# Patient Record
Sex: Female | Born: 2000
Health system: Southern US, Community
[De-identification: ages and names within clinical notes are randomized; demographics above are authoritative.]

## PROBLEM LIST (undated history)

## (undated) DIAGNOSIS — R51 Headache: Secondary | ICD-10-CM

## (undated) DIAGNOSIS — T426X1A Poisoning by other antiepileptic and sedative-hypnotic drugs, accidental (unintentional), initial encounter: Secondary | ICD-10-CM

## (undated) DIAGNOSIS — R519 Headache, unspecified: Secondary | ICD-10-CM

## (undated) DIAGNOSIS — S63502A Unspecified sprain of left wrist, initial encounter: Secondary | ICD-10-CM

## (undated) DIAGNOSIS — F39 Unspecified mood [affective] disorder: Secondary | ICD-10-CM

## (undated) DIAGNOSIS — E669 Obesity, unspecified: Secondary | ICD-10-CM

## (undated) DIAGNOSIS — F319 Bipolar disorder, unspecified: Secondary | ICD-10-CM

## (undated) DIAGNOSIS — T7840XA Allergy, unspecified, initial encounter: Secondary | ICD-10-CM

## (undated) DIAGNOSIS — F909 Attention-deficit hyperactivity disorder, unspecified type: Secondary | ICD-10-CM

## (undated) DIAGNOSIS — F419 Anxiety disorder, unspecified: Secondary | ICD-10-CM

## (undated) HISTORY — DX: Unspecified sprain of left wrist, initial encounter: S63.502A

## (undated) HISTORY — DX: Attention-deficit hyperactivity disorder, unspecified type: F90.9

## (undated) HISTORY — DX: Poisoning by other antiepileptic and sedative-hypnotic drugs, accidental (unintentional), initial encounter: T42.6X1A

## (undated) HISTORY — DX: Unspecified mood (affective) disorder: F39

---
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
## 2022-01-18 ENCOUNTER — Ambulatory Visit: Payer: BC Managed Care – PPO | Admitting: Family Medicine

## 2022-02-01 ENCOUNTER — Ambulatory Visit: Payer: BC Managed Care – PPO | Admitting: Family Medicine

## 2022-02-01 ENCOUNTER — Encounter: Payer: Self-pay | Admitting: Family Medicine

## 2022-02-01 VITALS — BP 115/71 | HR 94 | Temp 97.7°F | Ht 63.0 in | Wt 235.0 lb

## 2022-02-01 DIAGNOSIS — R635 Abnormal weight gain: Secondary | ICD-10-CM

## 2022-02-01 DIAGNOSIS — M7989 Other specified soft tissue disorders: Secondary | ICD-10-CM

## 2022-02-01 DIAGNOSIS — R42 Dizziness and giddiness: Secondary | ICD-10-CM | POA: Diagnosis not present

## 2022-02-01 DIAGNOSIS — R5383 Other fatigue: Secondary | ICD-10-CM

## 2022-02-01 NOTE — Patient Instructions (Signed)
Avoid 5 whites we discussed today.  ?Increase water consumption to at least 80 ounces a day.  ?Low salt diet.  ? ?We will call with lab results and further plan.  ? ? ? ? ?
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
## 2022-02-02 LAB — CBC WITH DIFFERENTIAL/PLATELET
Absolute Monocytes: 1154 cells/uL — ABNORMAL HIGH (ref 200–950)
Basophils Absolute: 56 cells/uL (ref 0–200)
Basophils Relative: 0.5 %
Eosinophils Absolute: 123 cells/uL (ref 15–500)
Eosinophils Relative: 1.1 %
HCT: 38.2 % (ref 35.0–45.0)
Hemoglobin: 13.1 g/dL (ref 11.7–15.5)
Lymphs Abs: 4166 cells/uL — ABNORMAL HIGH (ref 850–3900)
MCH: 31.6 pg (ref 27.0–33.0)
MCHC: 34.3 g/dL (ref 32.0–36.0)
MCV: 92.3 fL (ref 80.0–100.0)
MPV: 11.4 fL (ref 7.5–12.5)
Monocytes Relative: 10.3 %
Neutro Abs: 5701 cells/uL (ref 1500–7800)
Neutrophils Relative %: 50.9 %
Platelets: 250 10*3/uL (ref 140–400)
RBC: 4.14 10*6/uL (ref 3.80–5.10)
RDW: 13 % (ref 11.0–15.0)
Total Lymphocyte: 37.2 %
WBC: 11.2 10*3/uL — ABNORMAL HIGH (ref 3.8–10.8)

## 2022-02-02 LAB — COMPREHENSIVE METABOLIC PANEL
AG Ratio: 1.5 (calc) (ref 1.0–2.5)
ALT: 10 U/L (ref 5–32)
AST: 13 U/L (ref 12–32)
Albumin: 3.4 g/dL — ABNORMAL LOW (ref 3.6–5.1)
Alkaline phosphatase (APISO): 56 U/L (ref 36–128)
BUN: 8 mg/dL (ref 7–20)
CO2: 24 mmol/L (ref 20–32)
Calcium: 9.8 mg/dL (ref 8.9–10.4)
Chloride: 106 mmol/L (ref 98–110)
Creat: 0.53 mg/dL (ref 0.50–0.96)
Globulin: 2.3 g/dL (calc) (ref 2.0–3.8)
Glucose, Bld: 133 mg/dL — ABNORMAL HIGH (ref 65–99)
Potassium: 4.3 mmol/L (ref 3.8–5.1)
Sodium: 139 mmol/L (ref 135–146)
Total Bilirubin: 0.3 mg/dL (ref 0.2–1.1)
Total Protein: 5.7 g/dL — ABNORMAL LOW (ref 6.3–8.2)

## 2022-02-02 LAB — IRON,TIBC AND FERRITIN PANEL
%SAT: 23 % (calc) (ref 15–45)
Ferritin: 20 ng/mL (ref 16–154)
Iron: 94 ug/dL (ref 27–164)
TIBC: 409 mcg/dL (calc) (ref 271–448)

## 2022-02-02 LAB — TSH: TSH: 3.03 mIU/L

## 2022-02-02 LAB — HEMOGLOBIN A1C
Hgb A1c MFr Bld: 5.9 % of total Hgb — ABNORMAL HIGH (ref <5.7)
Mean Plasma Glucose: 123 mg/dL
eAG (mmol/L): 6.8 mmol/L

## 2022-02-04 ENCOUNTER — Telehealth: Payer: Self-pay | Admitting: Family Medicine

## 2022-02-04 DIAGNOSIS — R600 Localized edema: Secondary | ICD-10-CM | POA: Insufficient documentation

## 2022-02-04 DIAGNOSIS — R0609 Other forms of dyspnea: Secondary | ICD-10-CM | POA: Insufficient documentation

## 2022-02-04 DIAGNOSIS — E1169 Type 2 diabetes mellitus with other specified complication: Secondary | ICD-10-CM | POA: Insufficient documentation

## 2022-02-04 DIAGNOSIS — E661 Drug-induced obesity: Secondary | ICD-10-CM

## 2022-02-04 HISTORY — DX: Localized edema: R60.0

## 2022-02-04 HISTORY — DX: Other forms of dyspnea: R06.09

## 2022-02-04 MED ORDER — OZEMPIC (0.25 OR 0.5 MG/DOSE) 2 MG/3ML ~~LOC~~ SOPN: 0.2500 mg | PEN_INJECTOR | SUBCUTANEOUS | 5 refills | Status: DC

## 2022-02-04 NOTE — Telephone Encounter (Signed)
Spoke with pt mother regarding labs and instructions.  °

## 2022-02-04 NOTE — Telephone Encounter (Signed)
Please call patient: ?Her glucose is elevated at 133, this is in the diabetic range. ?Her A1c is elevated to 5.9 ?Blood cell counts, electrolytes and thyroid levels were all normal. ?Since she is already on metformin with an A1c of 5.9 and a elevated fasting glucose, I would recommend this trying Ozempic as an add-on. ?(FYI: We discussed this during the visit). ? ? ?Please inform patient I would like to follow-up with her in 3-4 weeks for nutrition counseling.  ?In the meantime I would like her to write down in a notebook everything she eats or drinks for 2 weeks so that we can review.  Make the changes we discussed during her visit. ? ?If/once Ozempic is approved, she can have a nurse visit to instruct on proper technique of weekly injection. ? ? ?

## 2022-02-18 DIAGNOSIS — F319 Bipolar disorder, unspecified: Secondary | ICD-10-CM | POA: Diagnosis not present

## 2022-02-18 DIAGNOSIS — F4010 Social phobia, unspecified: Secondary | ICD-10-CM | POA: Diagnosis not present

## 2022-02-21 ENCOUNTER — Ambulatory Visit: Payer: BC Managed Care – PPO

## 2022-03-17 IMAGING — DX DG FOOT COMPLETE 3+V*R*
3 series · 3 of 3 positions shown · non-contrast
Comparison: None.

CLINICAL DATA: Fall, bruising

EXAM:
RIGHT FOOT COMPLETE - 3+ VIEW; LEFT FOOT - COMPLETE 3+ VIEW

[foot lat]
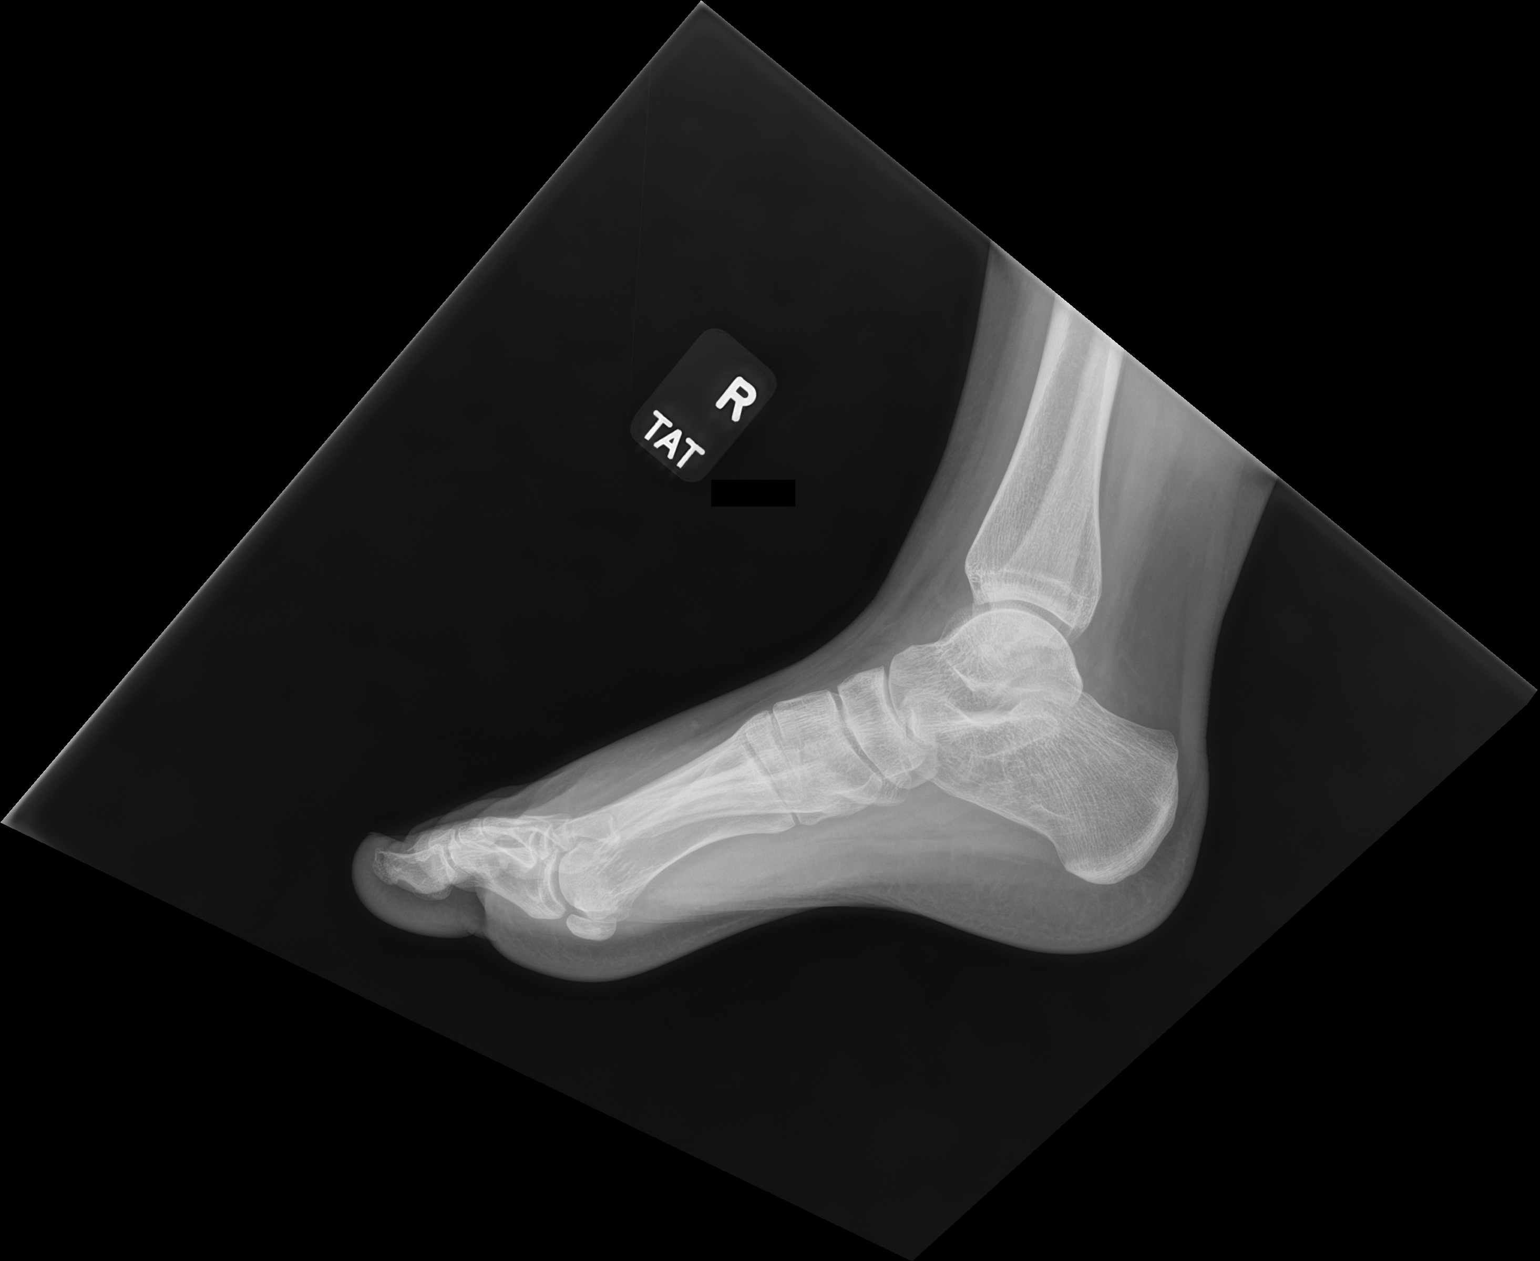

[foot obl]
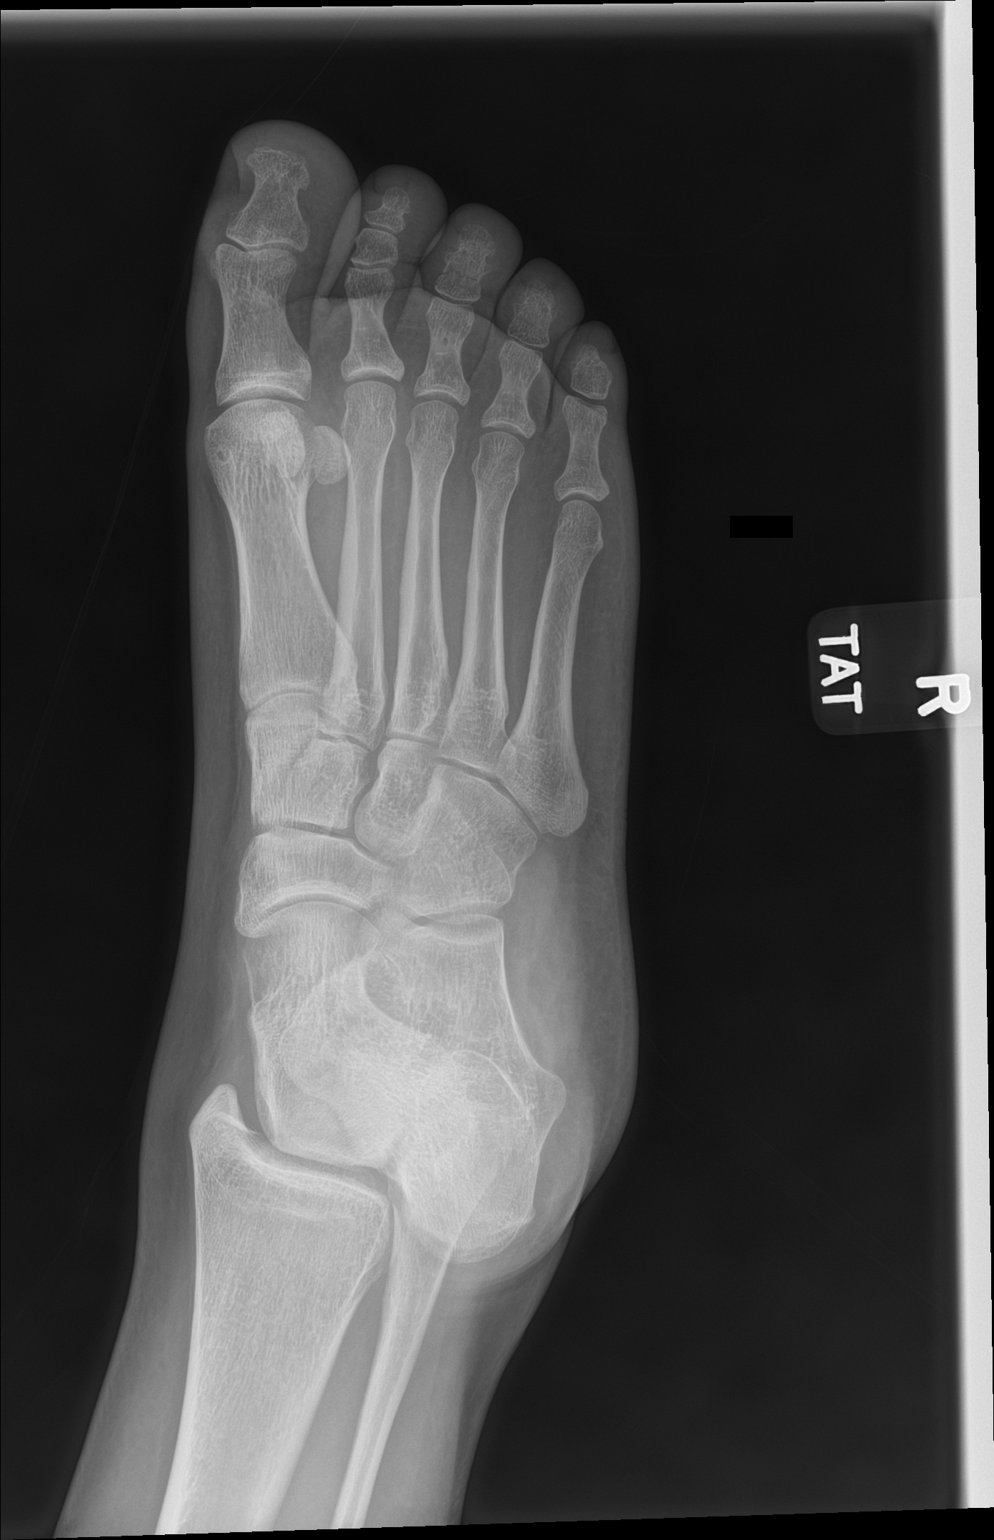

[foot ap]
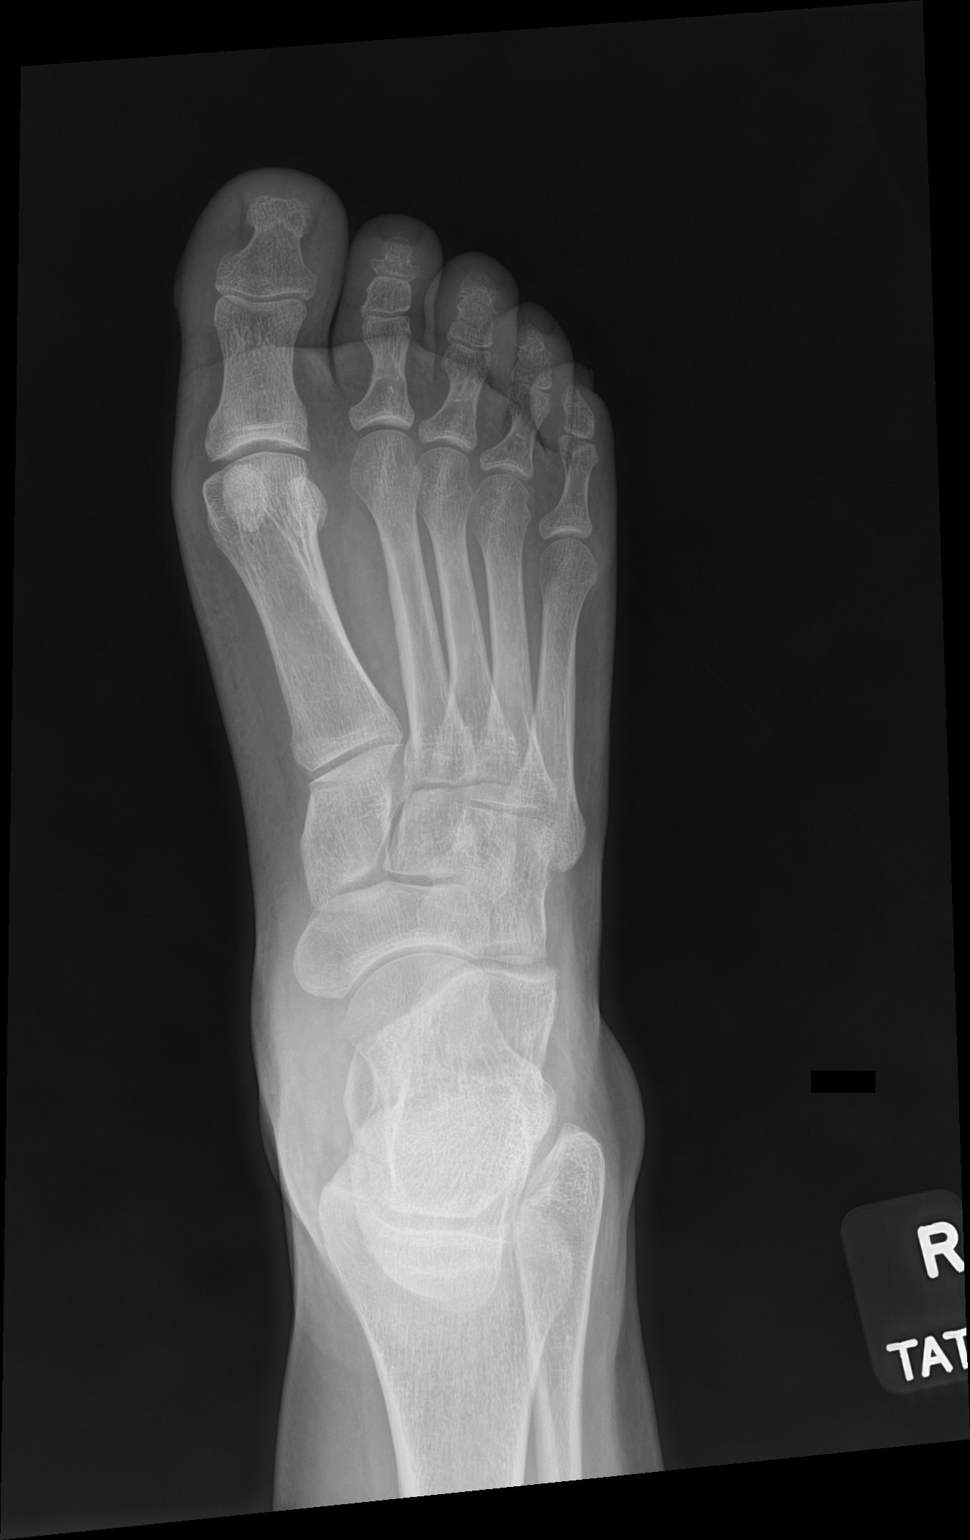

[3 of 3 positions shown; findings below may reference images not displayed]

FINDINGS: Right foot: Frontal, oblique, and lateral views of the right foot
are obtained. No acute fracture, subluxation, or dislocation. Joint
spaces are well preserved. Prominent soft tissue swelling of the
forefoot.

Left foot: Frontal, oblique, lateral views are obtained. No
fracture, subluxation, or dislocation. Joint spaces are well
preserved. Prominent soft tissue swelling of the forefoot.
IMPRESSION: 1. Bilateral soft tissue swelling. No acute fracture within either
foot.

## 2022-03-18 DIAGNOSIS — F4010 Social phobia, unspecified: Secondary | ICD-10-CM | POA: Diagnosis not present

## 2022-03-18 DIAGNOSIS — F319 Bipolar disorder, unspecified: Secondary | ICD-10-CM | POA: Diagnosis not present

## 2022-05-09 ENCOUNTER — Encounter: Payer: Self-pay | Admitting: Family Medicine

## 2022-05-22 DIAGNOSIS — F319 Bipolar disorder, unspecified: Secondary | ICD-10-CM | POA: Diagnosis not present

## 2022-05-22 DIAGNOSIS — F411 Generalized anxiety disorder: Secondary | ICD-10-CM | POA: Diagnosis not present

## 2022-05-22 DIAGNOSIS — Z79899 Other long term (current) drug therapy: Secondary | ICD-10-CM | POA: Diagnosis not present

## 2022-05-22 DIAGNOSIS — E661 Drug-induced obesity: Secondary | ICD-10-CM | POA: Diagnosis not present

## 2023-11-20 LAB — HEPATIC FUNCTION PANEL
AST: 13 (ref 13–35)
Alkaline Phosphatase: 60 (ref 25–125)
Bilirubin, Total: 0.3

## 2023-11-20 LAB — BASIC METABOLIC PANEL
BUN: 5 (ref 4–21)
Chloride: 102 (ref 99–108)
Creatinine: 0.6 (ref 0.5–1.1)
Glucose: 104
Potassium: 4.3 meq/L (ref 3.5–5.1)
Sodium: 138 (ref 137–147)

## 2023-11-20 LAB — HEMOGLOBIN A1C: Hemoglobin A1C: 6.4

## 2023-11-20 LAB — COMPREHENSIVE METABOLIC PANEL: Calcium: 9.7 (ref 8.7–10.7)

## 2023-11-20 LAB — CBC: RBC: 4.41 (ref 3.87–5.11)

## 2023-11-20 LAB — CBC AND DIFFERENTIAL
HCT: 41 (ref 36–46)
Hemoglobin: 13.9 (ref 12.0–16.0)
Neutrophils Absolute: 5.7
Platelets: 245 10*3/uL (ref 150–400)
WBC: 10.3

## 2023-11-20 LAB — TSH: TSH: 4.91 (ref 0.41–5.90)

## 2023-12-01 ENCOUNTER — Encounter: Payer: Self-pay | Admitting: Family Medicine

## 2023-12-08 ENCOUNTER — Ambulatory Visit (INDEPENDENT_AMBULATORY_CARE_PROVIDER_SITE_OTHER): Payer: 59 | Admitting: Family Medicine

## 2023-12-08 ENCOUNTER — Encounter: Payer: Self-pay | Admitting: Family Medicine

## 2023-12-08 ENCOUNTER — Telehealth: Payer: Self-pay

## 2023-12-08 VITALS — BP 99/65 | HR 76 | Ht 63.0 in | Wt 241.8 lb

## 2023-12-08 DIAGNOSIS — E1169 Type 2 diabetes mellitus with other specified complication: Secondary | ICD-10-CM | POA: Diagnosis not present

## 2023-12-08 DIAGNOSIS — Z6841 Body Mass Index (BMI) 40.0 and over, adult: Secondary | ICD-10-CM

## 2023-12-08 DIAGNOSIS — E66813 Obesity, class 3: Secondary | ICD-10-CM | POA: Diagnosis not present

## 2023-12-08 DIAGNOSIS — Z7984 Long term (current) use of oral hypoglycemic drugs: Secondary | ICD-10-CM

## 2023-12-08 DIAGNOSIS — R7989 Other specified abnormal findings of blood chemistry: Secondary | ICD-10-CM

## 2023-12-08 DIAGNOSIS — B354 Tinea corporis: Secondary | ICD-10-CM | POA: Diagnosis not present

## 2023-12-08 DIAGNOSIS — E661 Drug-induced obesity: Secondary | ICD-10-CM

## 2023-12-08 DIAGNOSIS — Z7985 Long-term (current) use of injectable non-insulin antidiabetic drugs: Secondary | ICD-10-CM

## 2023-12-08 MED ORDER — MOUNJARO 2.5 MG/0.5ML ~~LOC~~ SOAJ: 2.5000 mg | SUBCUTANEOUS | 2 refills | Status: DC

## 2023-12-08 MED ORDER — CLOTRIMAZOLE 1 % EX CREA: 1.0000 | TOPICAL_CREAM | Freq: Two times a day (BID) | CUTANEOUS | 0 refills | Status: AC

## 2023-12-08 NOTE — Telephone Encounter (Signed)
 Labs printed and given to PCP.

## 2023-12-08 NOTE — Patient Instructions (Addendum)
 Return in about 15 weeks (around 03/22/2024) for cpe (20 min).        Great to see you today.  I have refilled the medication(s) we provide.   If labs were collected or images ordered, we will inform you of  results once we have received them and reviewed. We will contact you either by echart message, or telephone call.  Please give ample time to the testing facility, and our office to run,  receive and review results. Please do not call inquiring of results, even if you can see them in your chart. We will contact you as soon as we are able. If it has been over 1 week since the test was completed, and you have not yet heard from [place], then please call us.    - echart message- for normal results that have been seen by the patient already.   - telephone call: abnormal results or if patient has not viewed results in their echart.  If a referral to a specialist was entered for you, please call us in 2 weeks if you have not heard from the specialist office to schedule.
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
## 2023-12-08 NOTE — Telephone Encounter (Signed)
 Alexis Estrada, Alexis Estrada 7:46 AM Please call [facility] pt and ask them to bring a copy of the labs and contact the psych to get a physcial copy of labs. I was not sent a copy    LVM for pt to return call.

## 2023-12-10 LAB — MICROALBUMIN / CREATININE URINE RATIO
Creatinine,U: 120.2 mg/dL
Microalb Creat Ratio: 5.8 mg/g (ref 0.0–30.0)
Microalb, Ur: <0.7 mg/dL (ref 0.0–1.9)

## 2023-12-11 ENCOUNTER — Telehealth: Payer: Self-pay

## 2023-12-11 ENCOUNTER — Encounter: Payer: Self-pay | Admitting: Family Medicine

## 2023-12-11 NOTE — Telephone Encounter (Signed)
 Please assist with PA.

## 2023-12-12 ENCOUNTER — Telehealth: Payer: Self-pay

## 2023-12-12 ENCOUNTER — Other Ambulatory Visit (HOSPITAL_COMMUNITY): Payer: Self-pay

## 2023-12-12 NOTE — Telephone Encounter (Signed)
 Waiting for insurance determination.

## 2023-12-12 NOTE — Telephone Encounter (Signed)
 Pharmacy Patient Advocate Encounter   Received notification from Pt Calls Messages that prior authorization for Mounjaro 2.5 mg is required/requested.   Insurance verification completed.   The patient is insured through [facility] MEDICAID .   Per test claim:  PA required and submitted Key/ EOC: [id number]

## 2023-12-12 NOTE — Telephone Encounter (Signed)
.  ospr

## 2023-12-15 ENCOUNTER — Telehealth: Payer: Self-pay

## 2023-12-15 ENCOUNTER — Other Ambulatory Visit (HOSPITAL_COMMUNITY): Payer: Self-pay

## 2023-12-15 NOTE — Telephone Encounter (Signed)
  Pharmacy Patient Advocate Encounter   Received notification from Physician's Office that prior authorization for Mounjaro 2.5 mg is required/requested.   Insurance verification completed.   The patient is insured through [facility] MEDICAID .   Per test claim: PA required and submitted KEY/EOC/Request #: [id number]  APPROVED from 12/12/23 to 12/11/24           Sharman Crate (Key: [id number]) PA Case ID #: [id number] Rx #: [id number] Need Help? Call us at [phone] Outcome Approved on March 7 by [facility] 2017 NCPDP Request Reference Number: [id number]. MOUNJARO INJ 2.5/0.5 is approved through 12/11/2024. Your patient may now fill this prescription and it will be covered. Effective Date: 12/12/2023 Authorization Expiration Date: 12/11/2024 Drug Greggory Keen 2.5MG /0.5ML auto-injectors ePA cloud logo Form [employer] Electronic Prior Authorization Form [phone] NCPDP) Original Claim Info 75

## 2024-02-12 ENCOUNTER — Other Ambulatory Visit

## 2024-02-16 ENCOUNTER — Ambulatory Visit: Admitting: "Endocrinology

## 2024-02-16 ENCOUNTER — Encounter: Payer: Self-pay | Admitting: "Endocrinology

## 2024-02-16 VITALS — BP 130/80 | HR 102 | Ht 63.0 in | Wt 238.0 lb

## 2024-02-16 DIAGNOSIS — R946 Abnormal results of thyroid function studies: Secondary | ICD-10-CM | POA: Diagnosis not present

## 2024-02-16 LAB — T4, FREE: Free T4: 1.3 ng/dL (ref 0.8–1.8)

## 2024-02-16 LAB — T3, FREE: T3, Free: 3.5 pg/mL (ref 2.3–4.2)

## 2024-02-16 LAB — TSH: TSH: 5.54 m[IU]/L — ABNORMAL HIGH
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
## 2024-02-17 ENCOUNTER — Ambulatory Visit: Payer: Self-pay | Admitting: "Endocrinology

## 2024-02-19 ENCOUNTER — Other Ambulatory Visit: Payer: Self-pay | Admitting: "Endocrinology

## 2024-02-19 DIAGNOSIS — E038 Other specified hypothyroidism: Secondary | ICD-10-CM

## 2024-02-19 MED ORDER — LEVOTHYROXINE SODIUM 50 MCG PO TABS: 50.0000 ug | ORAL_TABLET | Freq: Every day | ORAL | 1 refills | Status: AC

## 2024-05-24 ENCOUNTER — Other Ambulatory Visit

## 2024-05-24 LAB — TSH+FREE T4: TSH W/REFLEX TO FT4: 1.85 m[IU]/L

## 2024-05-31 ENCOUNTER — Ambulatory Visit: Admitting: "Endocrinology

## 2024-05-31 ENCOUNTER — Encounter: Payer: Self-pay | Admitting: "Endocrinology

## 2024-05-31 VITALS — BP 112/80 | HR 84 | Ht 63.0 in | Wt 225.0 lb

## 2024-05-31 DIAGNOSIS — E038 Other specified hypothyroidism: Secondary | ICD-10-CM

## 2024-05-31 DIAGNOSIS — Z6839 Body mass index (BMI) 39.0-39.9, adult: Secondary | ICD-10-CM

## 2024-05-31 DIAGNOSIS — E66812 Obesity, class 2: Secondary | ICD-10-CM

## 2024-05-31 DIAGNOSIS — R7303 Prediabetes: Secondary | ICD-10-CM

## 2024-05-31 MED ORDER — TIRZEPATIDE 5 MG/0.5ML ~~LOC~~ SOAJ
5.0000 mg | SUBCUTANEOUS | 0 refills | Status: AC
Start: 2024-05-31 — End: ?

## 2024-05-31 NOTE — Patient Instructions (Signed)
 05/31/24: start Mounjaro  5 mg/week  No history of MEN syndrome/medullary thyroid  cancer/pancreatitis or pancreatic cancer in self or family Maintain healthy lifestyle including 1200 Cal/day, 30 min of activity/day, avoiding refined/processed/outside food 20 minutes physical activity per day, in continuum or interruptedly through the day  Goals: less than 60 grams of carbohydrate/meal,10,000 steps a day and weight loss of 0.5-1 lb/ wk  Sleep 7-9 hours/day, adapt good sleep hygiene Adapt de-stressing and relaxation techniques to prevent stress induced weight gain Avoid/switch medications that lead to weight gain by discussing with the prescribing physician
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
## 2024-08-23 ENCOUNTER — Ambulatory Visit: Admitting: "Endocrinology

## 2024-10-12 ENCOUNTER — Ambulatory Visit: Admitting: "Endocrinology

## 2024-11-08 ENCOUNTER — Ambulatory Visit: Admitting: "Endocrinology

## 2024-12-06 ENCOUNTER — Ambulatory Visit: Admitting: "Endocrinology
